# Patient Record
Sex: Female | Born: 1995 | Race: Black or African American | Hispanic: No | Marital: Single
Health system: Southern US, Community
[De-identification: ages and names within clinical notes are randomized; demographics above are authoritative.]

## PROBLEM LIST (undated history)

## (undated) DIAGNOSIS — F913 Oppositional defiant disorder: Secondary | ICD-10-CM

## (undated) DIAGNOSIS — Q8740 Marfan's syndrome, unspecified: Secondary | ICD-10-CM

## (undated) DIAGNOSIS — E079 Disorder of thyroid, unspecified: Secondary | ICD-10-CM

## (undated) DIAGNOSIS — F4310 Post-traumatic stress disorder, unspecified: Secondary | ICD-10-CM

## (undated) DIAGNOSIS — F319 Bipolar disorder, unspecified: Secondary | ICD-10-CM

---
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
## 2014-02-18 ENCOUNTER — Encounter (HOSPITAL_COMMUNITY): Payer: Self-pay | Admitting: Emergency Medicine

## 2014-02-18 ENCOUNTER — Emergency Department (HOSPITAL_COMMUNITY): Payer: Self-pay

## 2014-02-18 ENCOUNTER — Emergency Department (HOSPITAL_COMMUNITY)
Admission: EM | Admit: 2014-02-18 | Discharge: 2014-02-18 | Disposition: A | Discharge status: Home / Self Care | Payer: Self-pay | Attending: Emergency Medicine | Admitting: Emergency Medicine

## 2014-02-18 DIAGNOSIS — R079 Chest pain, unspecified: Secondary | ICD-10-CM | POA: Insufficient documentation

## 2014-02-18 DIAGNOSIS — F411 Generalized anxiety disorder: Secondary | ICD-10-CM | POA: Insufficient documentation

## 2014-02-18 DIAGNOSIS — Z5900 Homelessness unspecified: Secondary | ICD-10-CM | POA: Insufficient documentation

## 2014-02-18 DIAGNOSIS — Q8740 Marfan's syndrome, unspecified: Secondary | ICD-10-CM | POA: Insufficient documentation

## 2014-02-18 DIAGNOSIS — F419 Anxiety disorder, unspecified: Secondary | ICD-10-CM

## 2014-02-18 MED ORDER — IBUPROFEN 800 MG PO TABS
800.0000 mg | ORAL_TABLET | Freq: Once | ORAL | Status: AC
  Administered 2014-02-18: 800 mg via ORAL
  Filled 2014-02-18: qty 1

## 2014-02-18 NOTE — ED Notes (Signed)
Pt has very limited information, per EMS pt is currently homeless, was in foster care until she was [age] and since has been wondering the streets, ems reports that she has a heart condition that she hasn't taken her meds for because she can't afford them.

## 2014-02-18 NOTE — ED Notes (Signed)
Case worker spoke to pt and will consult with SW

## 2014-02-18 NOTE — ED Notes (Signed)
Report given to Dana, RN

## 2014-02-18 NOTE — Progress Notes (Signed)
CARE MANAGEMENT ED NOTE 02/18/2014  Patient:  [name]   Account Number:  [id number]  Date Initiated:  02/18/2014  Documentation initiated by:  [facility],Maguire Sime  Subjective/Objective Assessment:   MEDS ASST CONS.     Subjective/Objective Assessment Detail:     Action/Plan:   PROVIDED W/MED ASST RESOURCES/PCP/HEALTH INSURANCE RESOURCES.   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[employer].SHE DOES NOT AVE A PCP/HEALTH INSURANCE/OR A HOME.STATES SHE DOES NOT TAKE MEDICINE.ENCOURAGED HER TO ALLOW THE HOSPITAL STAFF TO ASSIST HER W/MEDICAL TREATMENT,OTHERWISE I WILL NOT BE ABLE TO ASSIST HER W/MEDS.HE VOICED UNDERSTANDING.EXPLAINED THAT [facility] IS HIGHLY ENCOURAGED FOR HER TO GO TO TOMORROW FOR ACCESS TO MORE RESOURCES-MEDS,PCP,INSURANCE.HE STATED SHE COULD NOT PROMISE ME SHE WOULD GO.I EXPLAINED THE MEDS ASST PROGRAM:1XUSE/NO NARCOTICS/SELECT PHARMACIES/$3 CO PAY-SHE CANNOT AFFORD-WOULD NEED TO OVERRIDE.SHE COULD ONLY USE THIS SERVICE IF SHE ALLOWS FOR MEDICAL TREATMENT WHILE IN THE HOSPITAL.SHE VOICED UNDERSTANDING.ASKED IF SHE FEELS SAFE OR ABUSED-SHE SAID I DON'T WANT TO TALK ABOUT THAT.INFORMED HER THAT A CSW WOULD ALSO TRY TO HELP W/CONNECTING HER W/COMMUNIITY RESOURCES.SHE VOICED UNDERSTANDING.WILL MONITOR IF MEDS ASST NEEDED.UPDATED CSW/NURSE.  CARE MANAGEMENT ED NOTE 02/18/2014  [facility]  [name]   Account Number:  [id number]  Date Initiated:  02/18/2014  Documentation initiated by:  [facility]Dawn Kiper  Subjective/Objective Assessment:   MEDS ASST CONS.     Subjective/Objective Assessment Detail:     Action/Plan:   PROVIDED W/MED ASST  RESOURCES/PCP/HEALTH INSURANCE RESOURCES.   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[employer].SHE DOES NOT AVE A PCP/HEALTH INSURANCE/OR A HOME.STATES SHE DOES NOT TAKE MEDICINE.ENCOURAGED HER TO ALLOW THE HOSPITAL STAFF TO ASSIST HER W/MEDICAL TREATMENT,OTHERWISE I WILL NOT BE ABLE TO ASSIST HER W/MEDS.HE VOICED UNDERSTANDING.EXPLAINED THAT [facility] IS HIGHLY ENCOURAGED FOR HER TO GO TO TOMORROW FOR ACCESS TO MORE RESOURCES-MEDS,PCP,INSURANCE.HE STATED SHE COULD NOT PROMISE ME SHE WOULD GO.I EXPLAINED THE MEDS ASST PROGRAM:1XUSE/NO NARCOTICS/SELECT PHARMACIES/$3 CO PAY-SHE CANNOT AFFORD-WOULD NEED TO OVERRIDE.SHE COULD ONLY USE THIS SERVICE IF SHE ALLOWS FOR MEDICAL TREATMENT WHILE IN THE HOSPITAL.SHE VOICED UNDERSTANDING.ASKED IF SHE FEELS SAFE OR ABUSED-SHE SAID I DON'T WANT TO TALK ABOUT THAT.INFORMED HER THAT A CSW WOULD ALSO TRY TO HELP W/CONNECTING HER W/COMMUNIITY RESOURCES.SHE VOICED UNDERSTANDING.WILL MONITOR IF MEDS ASST NEEDED.UPDATED CSW/NURSE.  CARE MANAGEMENT ED NOTE 02/18/2014  Patient:  [name]   Account Number:  [id number]  Date Initiated:  02/18/2014  Documentation initiated by:  [facility],Landy Dunnavant  Subjective/Objective Assessment:   MEDS ASST CONS.     Subjective/Objective Assessment Detail:     Action/Plan:   PROVIDED W/MED ASST RESOURCES/PCP/HEALTH INSURANCE RESOURCES.   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[employer].SHE DOES NOT AVE A PCP/HEALTH INSURANCE/OR A HOME.STATES SHE DOES NOT TAKE MEDICINE.ENCOURAGED HER TO ALLOW THE HOSPITAL STAFF TO ASSIST HER W/MEDICAL TREATMENT,OTHERWISE I WILL NOT BE ABLE TO ASSIST HER W/MEDS.HE VOICED UNDERSTANDING.EXPLAINED THAT [facility] IS HIGHLY ENCOURAGED FOR HER TO GO TO TOMORROW FOR ACCESS TO MORE RESOURCES-MEDS,PCP,INSURANCE.HE STATED SHE COULD NOT PROMISE ME SHE WOULD GO.I EXPLAINED THE MEDS ASST PROGRAM:1XUSE/NO NARCOTICS/SELECT PHARMACIES/$3 CO PAY-SHE CANNOT AFFORD-WOULD NEED TO OVERRIDE.SHE COULD ONLY USE THIS SERVICE IF SHE ALLOWS FOR MEDICAL TREATMENT WHILE IN THE HOSPITAL.SHE VOICED UNDERSTANDING.ASKED IF SHE FEELS SAFE OR ABUSED-SHE SAID I DON'T WANT TO TALK ABOUT THAT.INFORMED HER THAT A CSW WOULD ALSO TRY TO HELP W/CONNECTING HER W/COMMUNIITY RESOURCES.SHE VOICED UNDERSTANDING.WILL MONITOR IF MEDS ASST NEEDED.UPDATED CSW/NURSE.  CARE MANAGEMENT ED NOTE 02/18/2014  Patient:  [facility]   Account Number:  [id number]  Date Initiated:  02/18/2014  Documentation initiated by:  [facility]  Subjective/Objective Assessment:   MEDS ASST CONS.     Subjective/Objective Assessment Detail:     Action/Plan:   PROVIDED W/MED ASST RESOURCES/PCP/HEALTH INSURANCE RESOURCES.   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[employer].SHE DOES NOT AVE A PCP/HEALTH INSURANCE/OR A HOME.STATES SHE DOES NOT TAKE MEDICINE.ENCOURAGED HER TO ALLOW THE HOSPITAL STAFF TO ASSIST HER W/MEDICAL TREATMENT,OTHERWISE I WILL NOT BE  ABLE TO ASSIST HER W/MEDS.HE VOICED UNDERSTANDING.EXPLAINED THAT [facility] IS HIGHLY ENCOURAGED FOR HER TO GO TO TOMORROW FOR ACCESS TO MORE RESOURCES-MEDS,PCP,INSURANCE.HE STATED SHE COULD NOT PROMISE ME SHE WOULD GO.I EXPLAINED THE MEDS ASST PROGRAM:1XUSE/NO NARCOTICS/SELECT PHARMACIES/$3 CO PAY-SHE CANNOT AFFORD-WOULD NEED TO OVERRIDE.SHE COULD ONLY USE THIS SERVICE IF SHE ALLOWS FOR MEDICAL TREATMENT WHILE IN THE HOSPITAL.SHE VOICED UNDERSTANDING.ASKED IF SHE FEELS SAFE OR ABUSED-SHE SAID I DON'T WANT TO TALK ABOUT THAT.INFORMED HER THAT A CSW WOULD ALSO TRY TO HELP W/CONNECTING HER W/COMMUNIITY RESOURCES.SHE VOICED UNDERSTANDING.WILL MONITOR IF MEDS ASST NEEDED.UPDATED CSW/NURSE.  CARE MANAGEMENT ED NOTE 02/18/2014  Patient:  [facility]   Account Number:  [id number]  Date Initiated:  02/18/2014  Documentation initiated by:  [facility]  Subjective/Objective Assessment:   MEDS ASST CONS.     Subjective/Objective Assessment Detail:     Action/Plan:   PROVIDED W/MED ASST RESOURCES/PCP/HEALTH INSURANCE RESOURCES.   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[employer].SHE DOES NOT AVE A PCP/HEALTH INSURANCE/OR A HOME.STATES SHE DOES NOT TAKE MEDICINE.ENCOURAGED HER TO ALLOW THE HOSPITAL STAFF TO ASSIST HER W/MEDICAL TREATMENT,OTHERWISE I WILL NOT BE ABLE TO ASSIST HER W/MEDS.HE VOICED UNDERSTANDING.EXPLAINED THAT [facility] IS HIGHLY ENCOURAGED FOR HER TO GO TO TOMORROW FOR ACCESS TO MORE RESOURCES-MEDS,PCP,INSURANCE.HE STATED SHE COULD NOT PROMISE ME SHE WOULD GO.I EXPLAINED THE MEDS ASST PROGRAM:1XUSE/NO NARCOTICS/SELECT  PHARMACIES/$3 CO PAY-SHE CANNOT AFFORD-WOULD NEED TO OVERRIDE.SHE COULD ONLY USE THIS SERVICE IF SHE ALLOWS FOR MEDICAL TREATMENT WHILE IN THE HOSPITAL.SHE VOICED UNDERSTANDING.ASKED IF SHE FEELS SAFE OR ABUSED-SHE SAID I DON'T WANT TO TALK ABOUT THAT.INFORMED HER THAT A CSW WOULD ALSO TRY TO HELP W/CONNECTING HER W/COMMUNIITY RESOURCES.SHE VOICED UNDERSTANDING.WILL MONITOR IF MEDS ASST NEEDED.UPDATED CSW/NURSE.

## 2014-02-18 NOTE — ED Notes (Signed)
Pt requesting to speak to a Child psychotherapistsocial worker while she is here

## 2014-02-18 NOTE — Progress Notes (Signed)
CARE MANAGEMENT ED NOTE 02/18/2014  Patient:  [name],[name]   Account Number:  [id number]  Date Initiated:  02/18/2014  Documentation initiated by:  [facility],Elmar Antigua  Subjective/Objective Assessment:   MEDS ASST CONS.     Subjective/Objective Assessment Detail:     Action/Plan:   PROVIDED W/MED ASST RESOURCES/PCP/HEALTH INSURANCE RESOURCES.   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[employer].SHE DOES NOT AVE A PCP/HEALTH INSURANCE/OR A HOME.STATES SHE DOES NOT TAKE MEDICINE.ENCOURAGED HER TO ALLOW THE HOSPITAL STAFF TO ASSIST HER W/MEDICAL TREATMENT,OTHERWISE I WILL NOT BE ABLE TO ASSIST HER W/MEDS.HE VOICED UNDERSTANDING.EXPLAINED THAT [facility] IS HIGHLY ENCOURAGED FOR HER TO GO TO TOMORROW FOR ACCESS TO MORE RESOURCES-MEDS,PCP,INSURANCE.HE STATED SHE COULD NOT PROMISE ME SHE WOULD GO.I EXPLAINED THE MEDS ASST PROGRAM:1XUSE/NO NARCOTICS/SELECT PHARMACIES/$3 CO PAY-SHE CANNOT AFFORD-WOULD NEED TO OVERRIDE.SHE COULD ONLY USE THIS SERVICE IF SHE ALLOWS FOR MEDICAL TREATMENT WHILE IN THE HOSPITAL.SHE VOICED UNDERSTANDING.ASKED IF SHE FEELS SAFE OR ABUSED-SHE SAID I DON'T WANT TO TALK ABOUT THAT.INFORMED HER THAT A CSW WOULD ALSO TRY TO HELP W/CONNECTING HER W/COMMUNIITY RESOURCES.SHE VOICED UNDERSTANDING.WILL MONITOR IF MEDS ASST NEEDED.UPDATED CSW/NURSE.

## 2014-02-18 NOTE — ED Provider Notes (Signed)
CSN: [id number]     Arrival date & time 02/18/14  0310 History   First MD Initiated Contact with Patient 02/18/14 [phone]     Chief Complaint  Patient presents with  . Anxiety  . Headache     (Consider location/radiation/quality/duration/timing/severity/associated sxs/prior Treatment) HPI Pt presenting with c/o chest pain. She states she has hx of Marfan's syndrome.  She states she has been in foster care her entire life and turned 17 earlier this year.  She states she has been staying different places and last night had argument with her friend which caused her to have to leave the house where she had been staying.  She states she was walking outside for several hours and then developed a sharp pain in her chest.  She states she then called 911.  She also c/o diffuse headache.  She denies SI/HI.  She is requesting to see a Child psychotherapistsocial worker to talk about her situation.  She states she doesn't know what to do because she is still in school and hasn't been able to graduate.  No sob, no leg swelling, no recent travel/trauma/surgery.  There are no other associated systemic symptoms, there are no other alleviating or modifying factors.   History reviewed. No pertinent past medical history. History reviewed. No pertinent past surgical history. History reviewed. No pertinent family history. History  Substance Use Topics  . Smoking status: Never Smoker   . Smokeless tobacco: Not on file  . Alcohol Use: No   OB History   Grav Para Term Preterm Abortions TAB SAB Ect Mult Living                 Review of Systems ROS reviewed and all otherwise negative except for mentioned in HPI    Allergies  Review of patient's allergies indicates no known allergies.  Home Medications  No current outpatient prescriptions on file. BP 122/69  Pulse 87  Temp(Src) 98.2 F (36.8 C) (Oral)  Resp 20  Ht 5' (1.524 m)  Wt 150 lb (68.04 kg)  BMI 29.30 kg/m2  SpO2 99%  LMP 01/18/2014 Vitals reviewed Physical  Exam Physical Examination: General appearance - alert, well appearing, and in no distress Mental status - alert, oriented to person, place, and time Eyes - no scleral icterus, no conjunctival injection Mouth - mucous membranes moist, pharynx normal without lesions Chest - clear to auscultation, no wheezes, rales or rhonchi, symmetric air entry Heart - normal rate, regular rhythm, normal S1, S2, no murmurs, rubs, clicks or gallops Abdomen - soft, nontender, nondistended, no masses or organomegaly Extremities - peripheral pulses normal, no pedal edema, no clubbing or cyanosis Skin - normal coloration and turgor, no rashes Psych- calm and cooperative  ED Course  Procedures (including critical care time)     11:06 AM pt has been seen by case management and social work.  They have given her resources and have called homeless shelters for her and she has declined.  SW is working on a [employer] voucher.    Labs Review Labs Reviewed - No data to display Imaging Review No results found.   EKG Interpretation None      MDM   Final diagnoses:  Chest pain  Anxiety  Homelessness    Pt presents primarily to the ED due to not having anywhere to stay.  She graduated from foster care at age 18 and has been staying with different people since then.  Last night after an argument at the house where she had been  staying she left and walked outside in the cold.  She had a sharp chest pain and then called 911.  She has refused EKG and CXR citing her inability to pay for these as her reason for not having them done.  I have discussed with her that we need to rule out emergent medical causes for her chest pain, she mentions having a hx of marfan.  Her primary reason however for coming to the ED was to see a Child psychotherapist.  She has been seen by social work and case management- they have given her resources - she has declined a homeless shelter at this time.      Ethelda Chick, MD 02/18/14 1351

## 2014-02-18 NOTE — ED Notes (Signed)
Pt resting in room, in no acute distress 

## 2014-02-18 NOTE — ED Notes (Signed)
Pt refused EKG and CXR stating,"I can't afford it." Dr. Karma GanjaLinker notified. Pt reports that she has hx of migraines and has HA 7/10. Pt requests to speak to social work. Will notify SW when they arrive. Pt is A&O and in NAD.

## 2014-02-18 NOTE — Progress Notes (Signed)
CSW met with pt to discuss housing resources.   Pt raised in foster care and turned 17 in July. Has been staying with friends and in hotels since then. Currently going to [facility]. Pt states that she is enrolled in [employer] and will be entering the program at the end of the month.  Pt stated that she was staying with a friend from school until last night when pt was put out because she did not have enough money to pay to stay. Pt called job Designer, [employer], who agreed to work on arranging housing until she can enroll in the program at end of April. Pt then called friend she was staying with, and arranged for a further short term stay of 1-2 days.   Provided list of housing resources. Provided [facility], a center specifically for individuals aging out of foster care. Reviewed  community clinic information provided by RN CM. Discussed medicaid application process, as she qualifies.  Rochele Pages,     ED CSW  phone: [phone] 10:45am

## 2014-02-18 NOTE — ED Notes (Signed)
Spoke with Trinna PostAlex, SW about pt situation. Alex to see pt in ED as well as talk to case management to see pt.

## 2014-02-18 NOTE — ED Notes (Addendum)
Patient refused EKG. RN Darl PikesSusan notified as well as MD Karma GanjaLinker

## 2014-02-18 NOTE — Discharge Instructions (Signed)
Return to the ED with any concerns including difficulty breathing, fainting, decreased level of alertness/lethargy, or any other alarming symptoms   Emergency Department Resource Guide 1) Find a Doctor and Pay Out of Pocket Although you won't have to find out who is covered by your insurance plan, it is a good idea to ask around and get recommendations. You will then need to call the office and see if the doctor you have chosen will accept you as a new patient and what types of options they offer for patients who are self-pay. Some doctors offer discounts or will set up payment plans for their patients who do not have insurance, but you will need to ask so you aren't surprised when you get to your appointment.  2) Contact Your Local Health Department Not all health departments have doctors that can see patients for sick visits, but many do, so it is worth a call to see if yours does. If you don't know where your local health department is, you can check in your phone book. The CDC also has a tool to help you locate your state's health department, and many state websites also have listings of all of their local health departments.  3) Find a Walk-in Clinic If your illness is not likely to be very severe or complicated, you may want to try a walk in clinic. These are popping up all over the country in pharmacies, drugstores, and shopping centers. They're usually staffed by nurse practitioners or physician assistants that have been trained to treat common illnesses and complaints. They're usually fairly quick and inexpensive. However, if you have serious medical issues or chronic medical problems, these are probably not your best option.  No Primary Care Doctor: - Call Health Connect at  [phone] - they can help you locate a primary care doctor that  accepts your insurance, provides certain services, etc. - Physician Referral Service- [phone]  Chronic Pain Problems: Organization          Address  Phone   Notes  [facility]  [phone]) [phone] Patients need to be referred by their primary care doctor.   Medication Assistance: Organization         Address  Phone   Notes  [facility] Medication [facility] [address] [place], [place] [address] [phone] --Must be a resident of [facility] [place] -- Must have NO insurance coverage whatsoever (no Medicaid/ Medicare, etc.) -- The pt. MUST have a primary care doctor that directs their care regularly and follows them in the community   MedAssist  [phone]   [employer]  [phone]    Agencies that provide inexpensive medical care: Organization         Address  Phone   Notes  [facility]  [phone]) [phone]   [facility]    [phone]   [facility] [address] [place], [place] [address] [phone]   [facility] [address], [place] [phone]) [phone]   [facility]    [phone]   [facility]    [phone]) [phone]   [facility]  [address], [place] Phone:  [phone], Fax:  [phone]) [phone] Hours of Operation:  9 am - 6 pm, M-F.  Also accepts Medicaid/Medicare and self-pay.  [facility]  [address], [place] Phone: [phone]) [phone], Fax: [phone]. Hours of Operation:  8:30 am - 5:30 pm, M-F.  Also accepts Medicaid and self-pay.  [facility] [address] [place] Phone: [phone]   [place], [place], [place] [phone], Ext. [phone] Mondays & Thursdays: 7-9 AM.  First 15 patients are seen on a first come, first serve basis.    [place] Providers:  Organization         Address  Phone   Notes  [facility] [address], [place] [phone] Also accepts self-pay patients.  [facility] [address], [place]  [phone]   [place], [address], [place] [phone]   [facility] [address] [place] [phone]   Lucianne Lei [address], [place]   [phone] Only accepts [place] Access [place] patients after they have their name applied to their card.   Self-Pay (no insurance) in [facility]:  Organization         Address  Phone   Notes  Sickle Cell Patients, [facility] [place] [phone]   [facility] [phone]   [facility]  [place], [place], [place] [phone]   [facility]/Dr. Osei-Bonsu  [address] [place] or [address], [place] [phone] Phone number for both [place] and [place] locations is the same.  [facility] [address], [place] [phone]   [facility] [address] [place] or [address] [phone]   [facility] [address], [place] [phone], phone; [phone], fax Sees patients 1st and 3rd Saturday of every month.  Must not qualify for public or private insurance (i.e. Medicaid, Medicare, [place] Health Choice, Veterans' Benefits)  Household income should be no more than 200% of the poverty level The clinic cannot treat you if you are pregnant or think you are pregnant  Sexually transmitted diseases are not treated at the clinic.    Dental Care: Organization         Address  Phone  Notes  [facility] Department of [facility] [place] [phone] Accepts children up to age 48 who are enrolled in [place] or [place]; pregnant women with a Medicaid card; and  children who have applied for Medicaid or [place] Health Choice, but were declined, whose parents can pay a reduced fee at time of service.  [facility] Department of [facility]  [address], [place] [phone] Accepts children up to age 27 who are enrolled in [place] or [place]; pregnant women with a Medicaid card; and children who have applied for Medicaid or [place] Health Choice, but were declined, whose parents can pay a reduced fee at time of service.  [facility] Access PROGRAM  [place] [phone] Patients are seen by appointment only. Walk-ins are not accepted. [place] will see patients 50 years of age and older. Monday - Tuesday (8am-5pm) Most Wednesdays (8:30-5pm) $30 per visit, cash only  [facility] Adult [employer] PROGRAM  [address], [place] [phone]  Patients are seen by appointment only. Walk-ins are not accepted. [place] will see patients 8 years of age and older. One Wednesday Evening (Monthly: Volunteer Based).  $30 per visit, cash only  [place]  [phone] for adults; Children under age 70, call Graduate Pediatric Dentistry at [phone]. Children aged 44-14, please call [phone] to request a pediatric application.  Dental services are provided in all areas of dental care including fillings, crowns and bridges, complete and partial dentures, implants, gum treatment, root canals, and extractions. Preventive care is also provided. Treatment is provided to both adults and children. Patients are selected via a lottery and there is often a waiting list.   [facility] [address] [place]  [phone] [email or web address]   [facility] [address] [place], [place] [phone], Ext. [phone] Second and Fourth Thursday of each month, opens at 6:30 AM; Clinic ends at 9 AM.  Patients are seen on a first-come first-served  basis, and a limited number are seen during each clinic.   [facility]  [address] Hillard Danker [place], [place] [phone]   Eligibility Requirements You must have lived in [place], [place], or [place] counties for at least the last three months.   You cannot be eligible for state or federal sponsored [employer], including [employer], [place], or [employer].   You generally cannot be eligible for healthcare insurance through your employer.    How to apply: Eligibility screenings are held every Tuesday and Wednesday afternoon from 1:00 pm until 4:00 pm. You do not need an appointment for the interview!  [facility] [address], [place], [place]   [place] Department  [place]  [phone]    Behavioral Health Resources in the Community: Intensive Outpatient Programs Organization         Address  Phone  Notes  [facility] [address], [place], [place] [phone]   [facility] [address] [place], [place]   ADS: Alcohol & Drug Svcs [address] [place], [place]   [place] [address]  [place], [place] or [phone]   Substance Abuse Resources Organization         Address  Phone  Notes  Alcohol and Drug Services  [phone]   [place]  [phone]   [facility] [place]   Chinita Pester  [phone]   Residential & Outpatient Substance Abuse Program  [phone]   Psychological Services Organization         Address  Phone  Notes  [facility] [place]  Reserve  [phone]   [place] [address], [place] or [phone]    Mobile Crisis Teams Organization          Address  Phone  Notes  [facility], Mobile Crisis Care Unit  [phone]   [facility]  [address] [place], [place]   [place] [address], [place] [phone]    Self-Help/Support Groups Organization         Address  Phone             Notes  [place]. [facility] - variety of support groups  [place]  Call for more information  Narcotics Anonymous (NA), [facility] [address], [employer] [place]  2 meetings at this location   Residential Facilities manager         Address  Phone  Notes  [facility] [place],    [place]  [phone]   [facility]  [address] [place] 370488, [place], [place]   [place], [place] [phone] Admissions: 8am-3pm M-F  Incentives Substance [place] [address]    [place], [place] [phone]   [facility] [address] [place], [place], [place]   [facility] [address]  [place], [place]   [facility] - Intensive Outpatient [address], [place], [place]   [facility] ([place].,  [place], [place] [phone] or [phone]   [facility] (RTS) [address], [place], [place] Accepts Medicaid  [facility] [place] [address],  [place] [phone] Substance Abuse/Addiction Treatment   [facility] Organization         Address  Phone  Notes  [facility]  [phone]   Domenic Schwab, PhD [address] Arlis [place], [place]   [phone] or [phone]   [place], [place] [phone]   [facility] [address] [place], [place] [phone] Insurance/Medicaid/sponsorship  through [facility] Families [address], [address] [place]                                    [place], [place] [phone] [place] [address], [place] [phone]    Dr. Adele Schilder  [phone]   [facility] Dept. 1) [address] [place] 2) [place] 3)  [place] of [place] [address], [place] [phone]  [phone]   [place] [phone] or [phone] (After Hours)

## 2014-02-19 NOTE — Care Management ED Note (Addendum)
       CARE MANAGEMENT ED NOTE 02/18/2014  Patient:  [name],[name]   Account Number:  [id number]  Date Initiated:  02/18/2014  Documentation initiated by:  [facility]HABIR,Ahmod Gillespie  Subjective/Objective Assessment:   MEDS ASST CONS.     Subjective/Objective Assessment Detail:     Action/Plan:   PROVIDED W/MED ASST RESOURCES/PCP/HEALTH INSURANCE RESOURCES.   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[employer].SHE DOES NOT AVE A PCP/HEALTH INSURANCE/OR A HOME.STATES SHE DOES NOT TAKE MEDICINE.ENCOURAGED HER TO ALLOW THE HOSPITAL STAFF TO ASSIST HER W/MEDICAL TREATMENT,OTHERWISE I WILL NOT BE ABLE TO ASSIST HER W/MEDS.HE VOICED UNDERSTANDING.EXPLAINED THAT THE [facility] IS HIGHLY ENCOURAGED FOR HER TO GO TO TOMORROW FOR ACCESS TO MORE RESOURCES-MEDS,PCP,INSURANCE.HE STATED SHE COULD NOT PROMISE ME SHE WOULD GO.I EXPLAINED THE MEDS ASST PROGRAM:1XUSE/NO NARCOTICS/SELECT PHARMACIES/$3 CO PAY-SHE CANNOT AFFORD-WOULD NEED TO OVERRIDE.SHE COULD ONLY USE THIS SERVICE IF SHE ALLOWS FOR MEDICAL TREATMENT WHILE IN THE HOSPITAL.SHE VOICED UNDERSTANDING.ASKED IF SHE FEELS SAFE OR ABUSED-SHE SAID I DON'T WANT TO TALK ABOUT THAT.INFORMED HER THAT A CSW WOULD ALSO TRY TO HELP W/CONNECTING HER W/COMMUNIITY RESOURCES.SHE VOICED UNDERSTANDING.WILL MONITOR IF MEDS ASST NEEDED.UPDATED CSW/NURSE.

## 2014-07-03 ENCOUNTER — Encounter (HOSPITAL_COMMUNITY): Payer: Self-pay | Admitting: Emergency Medicine

## 2014-07-03 DIAGNOSIS — R112 Nausea with vomiting, unspecified: Secondary | ICD-10-CM | POA: Diagnosis not present

## 2014-07-03 DIAGNOSIS — Q8740 Marfan's syndrome, unspecified: Secondary | ICD-10-CM | POA: Diagnosis not present

## 2014-07-03 DIAGNOSIS — Z8639 Personal history of other endocrine, nutritional and metabolic disease: Secondary | ICD-10-CM | POA: Insufficient documentation

## 2014-07-03 DIAGNOSIS — R51 Headache: Secondary | ICD-10-CM | POA: Diagnosis present

## 2014-07-03 DIAGNOSIS — R42 Dizziness and giddiness: Secondary | ICD-10-CM | POA: Diagnosis not present

## 2014-07-03 DIAGNOSIS — Z862 Personal history of diseases of the blood and blood-forming organs and certain disorders involving the immune mechanism: Secondary | ICD-10-CM | POA: Diagnosis not present

## 2014-07-03 LAB — COMPREHENSIVE METABOLIC PANEL
ALK PHOS: 56 U/L (ref 39–117)
AST: 14 U/L (ref 0–37)
Albumin: 3.9 g/dL (ref 3.5–5.2)
Anion gap: 10 (ref 5–15)
BILIRUBIN TOTAL: 1.1 mg/dL (ref 0.3–1.2)
BUN: 10 mg/dL (ref 6–23)
CHLORIDE: 101 meq/L (ref 96–112)
CO2: 25 mEq/L (ref 19–32)
Calcium: 9.6 mg/dL (ref 8.4–10.5)
Creatinine, Ser: 0.56 mg/dL (ref 0.50–1.10)
GFR calc non Af Amer: >90 mL/min (ref >90)
GLUCOSE: 118 mg/dL — AB (ref 70–99)
POTASSIUM: 3.8 meq/L (ref 3.7–5.3)
SODIUM: 136 meq/L — AB (ref 137–147)
Total Protein: 7.8 g/dL (ref 6.0–8.3)

## 2014-07-03 LAB — CBC
HCT: 33.8 % — ABNORMAL LOW (ref 36.0–46.0)
Hemoglobin: 11 g/dL — ABNORMAL LOW (ref 12.0–15.0)
MCH: 25.6 pg — AB (ref 26.0–34.0)
MCHC: 32.5 g/dL (ref 30.0–36.0)
MCV: 78.6 fL (ref 78.0–100.0)
Platelets: 208 10*3/uL (ref 150–400)
RBC: 4.3 MIL/uL (ref 3.87–5.11)
RDW: 14.9 % (ref 11.5–15.5)
WBC: 3.1 10*3/uL — ABNORMAL LOW (ref 4.0–10.5)

## 2014-07-03 NOTE — ED Notes (Signed)
Pt reports she is homeless; been with a friend but was headed to stay in a church tonight. Pt reports she needs so help and resources. Denies having family in the area. Denies abuse.

## 2014-07-03 NOTE — ED Notes (Signed)
Pt reports nausea, vomiting, headache, light sensitivity, dizziness when woke up this morning around 0800. NO known hx of migraines. Hypothyroidism and not taking meds since September 2014. 4 zofran given by EMS and pt is feeling better.

## 2014-07-04 ENCOUNTER — Emergency Department (HOSPITAL_COMMUNITY): Payer: Medicaid Other

## 2014-07-04 ENCOUNTER — Emergency Department (HOSPITAL_COMMUNITY)
Admission: EM | Admit: 2014-07-04 | Discharge: 2014-07-04 | Discharge status: Against Medical Advice | Payer: Medicaid Other | Attending: Emergency Medicine | Admitting: Emergency Medicine

## 2014-07-04 ENCOUNTER — Encounter (HOSPITAL_COMMUNITY): Payer: Self-pay

## 2014-07-04 DIAGNOSIS — R51 Headache: Secondary | ICD-10-CM

## 2014-07-04 DIAGNOSIS — R519 Headache, unspecified: Secondary | ICD-10-CM

## 2014-07-04 HISTORY — DX: Marfan syndrome, unspecified: Q87.40

## 2014-07-04 HISTORY — DX: Disorder of thyroid, unspecified: E07.9

## 2014-07-04 LAB — TSH: TSH: 1.34 u[IU]/mL (ref 0.350–4.500)

## 2014-07-04 MED ORDER — ONDANSETRON HCL 4 MG/2ML IJ SOLN
4.0000 mg | Freq: Once | INTRAMUSCULAR | Status: AC
  Administered 2014-07-04: 4 mg via INTRAVENOUS
  Filled 2014-07-04 (×2): qty 2

## 2014-07-04 MED ORDER — SODIUM CHLORIDE 0.9 % IV BOLUS (SEPSIS)
1000.0000 mL | Freq: Once | INTRAVENOUS | Status: AC
  Administered 2014-07-04: 1000 mL via INTRAVENOUS

## 2014-07-04 MED ORDER — DIPHENHYDRAMINE HCL 50 MG/ML IJ SOLN
25.0000 mg | Freq: Once | INTRAMUSCULAR | Status: AC
  Administered 2014-07-04: 25 mg via INTRAVENOUS
  Filled 2014-07-04 (×2): qty 1

## 2014-07-04 MED ORDER — METOCLOPRAMIDE HCL 5 MG/ML IJ SOLN
10.0000 mg | Freq: Once | INTRAMUSCULAR | Status: AC
  Administered 2014-07-04: 10 mg via INTRAVENOUS
  Filled 2014-07-04 (×2): qty 2

## 2014-07-04 NOTE — ED Notes (Signed)
Patient transported to CT 

## 2014-07-04 NOTE — Discharge Instructions (Signed)
General Headache Without Cause You are leaving against medical advice as you do not want a lumbar puncture or CT angiogram. Return to the ED if you wish to be reevaluated. A headache is pain or discomfort felt around the head or neck area. The specific cause of a headache may not be found. There are many causes and types of headaches. A few common ones are:  Tension headaches.  Migraine headaches.  Cluster headaches.  Chronic daily headaches. HOME CARE INSTRUCTIONS   Keep all follow-up appointments with your caregiver or any specialist referral.  Only take over-the-counter or prescription medicines for pain or discomfort as directed by your caregiver.  Lie down in a dark, quiet room when you have a headache.  Keep a headache journal to find out what may trigger your migraine headaches. For example, write down:  What you eat and drink.  How much sleep you get.  Any change to your diet or medicines.  Try massage or other relaxation techniques.  Put ice packs or heat on the head and neck. Use these 3 to 4 times per day for 15 to 20 minutes each time, or as needed.  Limit stress.  Sit up straight, and do not tense your muscles.  Quit smoking if you smoke.  Limit alcohol use.  Decrease the amount of caffeine you drink, or stop drinking caffeine.  Eat and sleep on a regular schedule.  Get 7 to 9 hours of sleep, or as recommended by your caregiver.  Keep lights dim if bright lights bother you and make your headaches worse. SEEK MEDICAL CARE IF:   You have problems with the medicines you were prescribed.  Your medicines are not working.  You have a change from the usual headache.  You have nausea or vomiting. SEEK IMMEDIATE MEDICAL CARE IF:   Your headache becomes severe.  You have a fever.  You have a stiff neck.  You have loss of vision.  You have muscular weakness or loss of muscle control.  You start losing your balance or have trouble walking.  You  feel faint or pass out.  You have severe symptoms that are different from your first symptoms. MAKE SURE YOU:   Understand these instructions.  Will watch your condition.  Will get help right away if you are not doing well or get worse. Document Released: 11/02/2005 Document Revised: 01/25/2012 Document Reviewed: 11/18/2011 [facility] Patient Information 2015 [place], [place]. This information is not intended to replace advice given to you by your health care provider. Make sure you discuss any questions you have with your health care provider.

## 2014-07-04 NOTE — ED Provider Notes (Signed)
CSN: [id number]     Arrival date & time 07/03/14  2244 History   First MD Initiated Contact with Patient 07/04/14 0209     Chief Complaint  Patient presents with  . Emesis     (Consider location/radiation/quality/duration/timing/severity/associated sxs/prior Treatment) HPI Comments: Patient awoke with diffuse headache this morning and felt lightheaded. She took some medicine and some food and began to have nausea and vomiting. After having Tylenol she had 4-5 episodes of vomiting. Her headache gradually got worse. Denies thunderclap onset. Denies fever. No history of migraine headaches. She endorses photophobia. She denies any phonophobia. She has a history of Marfan's syndrome and hypothyroidism. She's not had any medications since October. Denies any chest pain, abdominal pain, back pain. No focal weakness, numbness or tingling. No blurry vision or double vision. She received Zofran with some relief. Denies being pregnant. Denies sick contacts.  The history is provided by the patient.    Past Medical History  Diagnosis Date  . Thyroid disease   . Marfan's syndrome    No past surgical history on file. No family history on file. History  Substance Use Topics  . Smoking status: Not on file  . Smokeless tobacco: Not on file  . Alcohol Use: Not on file   OB History   Grav Para Term Preterm Abortions TAB SAB Ect Mult Living                 Review of Systems  Constitutional: Negative for fever, activity change and appetite change.  HENT: Negative for congestion.   Respiratory: Negative for cough, chest tightness and shortness of breath.   Cardiovascular: Negative for chest pain.  Gastrointestinal: Positive for nausea and vomiting. Negative for abdominal pain.  Genitourinary: Negative for dysuria, hematuria, vaginal bleeding and vaginal discharge.  Musculoskeletal: Negative for arthralgias, back pain, myalgias and neck pain.  Skin: Negative for rash.  Neurological: Positive for  dizziness, light-headedness and headaches. Negative for weakness.  A complete 10 system review of systems was obtained and all systems are negative except as noted in the HPI and PMH.      Allergies  Review of patient's allergies indicates no known allergies.  Home Medications   Prior to Admission medications   Not on File   BP 113/74  Pulse 54  Temp(Src) 98.8 F (37.1 C) (Oral)  Resp 16  Wt 156 lb (70.761 kg)  SpO2 100%  LMP 05/27/2014 Physical Exam  Nursing note and vitals reviewed. Constitutional: She is oriented to person, place, and time. She appears well-developed and well-nourished. No distress.  marfanoid  HENT:  Head: Normocephalic and atraumatic.  Mouth/Throat: Oropharynx is clear and moist. No oropharyngeal exudate.  No temporal artery tenderness  Eyes: Conjunctivae and EOM are normal. Pupils are equal, round, and reactive to light.  Neck: Normal range of motion. Neck supple.  No meningismus.  Cardiovascular: Normal rate, regular rhythm, normal heart sounds and intact distal pulses.   No murmur heard. Pulmonary/Chest: Effort normal and breath sounds normal. No respiratory distress.  Abdominal: Soft. There is no tenderness. There is no rebound and no guarding.  Musculoskeletal: Normal range of motion. She exhibits no edema and no tenderness.  Neurological: She is alert and oriented to person, place, and time. No cranial nerve deficit. She exhibits normal muscle tone. Coordination normal.  No ataxia on finger to nose bilaterally. No pronator drift. 5/5 strength throughout. CN 2-12 intact. Negative Romberg. Equal grip strength. Sensation intact. Gait is normal.   Skin: Skin is  warm.  Psychiatric: She has a normal mood and affect. Her behavior is normal.    ED Course  Procedures (including critical care time) Labs Review Labs Reviewed  COMPREHENSIVE METABOLIC PANEL - Abnormal; Notable for the following:    Sodium 136 (*)    Glucose, Bld 118 (*)    All other  components within normal limits  CBC - Abnormal; Notable for the following:    WBC 3.1 (*)    Hemoglobin 11.0 (*)    HCT 33.8 (*)    MCH 25.6 (*)    All other components within normal limits  TSH  URINALYSIS, ROUTINE W REFLEX MICROSCOPIC  PREGNANCY, URINE    Imaging Review Ct Head Wo Contrast  07/04/2014   CLINICAL DATA:  Headache and vomiting.  EXAM: CT HEAD WITHOUT CONTRAST  TECHNIQUE: Contiguous axial images were obtained from the base of the skull through the vertex without intravenous contrast.  COMPARISON:  None.  FINDINGS: The ventricles and sulci are normal. No intraparenchymal hemorrhage, mass effect nor midline shift. No acute large vascular territory infarcts.  No abnormal extra-axial fluid collections. Basal cisterns are patent.  No skull fracture. The included ocular globes and orbital contents are non-suspicious. The mastoid aircells and included paranasal sinuses are well-aerated.  IMPRESSION: No acute intracranial process; normal noncontrast CT of the head.   Electronically Signed   By: Awilda Metroourtnay  Bloomer   On: 07/04/2014 03:24     EKG Interpretation None      MDM   Final diagnoses:  Headache, unspecified headache type   headache with lightheadedness, nausea and vomiting. No fever. Nonfocal neurological exam.  CT scan negative. With history of Marfan's syndrome, there is increased concern for subarachnoid hemorrhage and possibly aneurysm.  Patient declines lumbar puncture. She also declines CT angiogram. She understands that CT scan could miss a small proportion of subarachnoid hemorrhage. She understands that subarachnoid hemorrhage could be life-threatening. She is alert and oriented x3. She has capacity to make her own medical decisions and refuses further evaluation of her headache. She states she is feeling better and wishes to go home. She understands she'll be leaving AGAINST MEDICAL ADVICE as lumbar puncture or CTA was recommended.  Glynn OctaveStephen Shara Hartis,  MD 07/04/14 [phone]

## 2014-07-04 NOTE — Progress Notes (Signed)
Patient refused CT Scan; Dr. Manus Gunningancour notified and patient returned to room in ED.

## 2014-10-15 ENCOUNTER — Encounter (HOSPITAL_COMMUNITY): Payer: Self-pay | Admitting: Emergency Medicine

## 2014-10-15 ENCOUNTER — Emergency Department (HOSPITAL_COMMUNITY)
Admission: EM | Admit: 2014-10-15 | Discharge: 2014-10-15 | Disposition: A | Discharge status: Home / Self Care | Payer: 59 | Attending: Emergency Medicine | Admitting: Emergency Medicine

## 2014-10-15 DIAGNOSIS — F4310 Post-traumatic stress disorder, unspecified: Secondary | ICD-10-CM | POA: Insufficient documentation

## 2014-10-15 DIAGNOSIS — Y998 Other external cause status: Secondary | ICD-10-CM | POA: Diagnosis not present

## 2014-10-15 DIAGNOSIS — Z79899 Other long term (current) drug therapy: Secondary | ICD-10-CM | POA: Insufficient documentation

## 2014-10-15 DIAGNOSIS — F32A Depression, unspecified: Secondary | ICD-10-CM

## 2014-10-15 DIAGNOSIS — E079 Disorder of thyroid, unspecified: Secondary | ICD-10-CM | POA: Diagnosis not present

## 2014-10-15 DIAGNOSIS — Z3202 Encounter for pregnancy test, result negative: Secondary | ICD-10-CM | POA: Diagnosis not present

## 2014-10-15 DIAGNOSIS — F913 Oppositional defiant disorder: Secondary | ICD-10-CM | POA: Insufficient documentation

## 2014-10-15 DIAGNOSIS — F329 Major depressive disorder, single episode, unspecified: Secondary | ICD-10-CM

## 2014-10-15 DIAGNOSIS — T7421XA Adult sexual abuse, confirmed, initial encounter: Secondary | ICD-10-CM | POA: Diagnosis not present

## 2014-10-15 DIAGNOSIS — Q8740 Marfan's syndrome, unspecified: Secondary | ICD-10-CM | POA: Diagnosis not present

## 2014-10-15 LAB — BASIC METABOLIC PANEL
Anion gap: 12 (ref 5–15)
BUN: 4 mg/dL — ABNORMAL LOW (ref 6–23)
CO2: 23 mEq/L (ref 19–32)
Calcium: 10.1 mg/dL (ref 8.4–10.5)
Chloride: 101 mEq/L (ref 96–112)
Creatinine, Ser: 0.5 mg/dL (ref 0.50–1.10)
Glucose, Bld: 103 mg/dL — ABNORMAL HIGH (ref 70–99)
POTASSIUM: 4.2 meq/L (ref 3.7–5.3)
SODIUM: 136 meq/L — AB (ref 137–147)

## 2014-10-15 LAB — CBC
HCT: 33.8 % — ABNORMAL LOW (ref 36.0–46.0)
HEMOGLOBIN: 10.6 g/dL — AB (ref 12.0–15.0)
MCH: 25.4 pg — ABNORMAL LOW (ref 26.0–34.0)
MCHC: 31.4 g/dL (ref 30.0–36.0)
MCV: 81.1 fL (ref 78.0–100.0)
PLATELETS: 218 10*3/uL (ref 150–400)
RBC: 4.17 MIL/uL (ref 3.87–5.11)
RDW: 15.2 % (ref 11.5–15.5)
WBC: 2.3 10*3/uL — ABNORMAL LOW (ref 4.0–10.5)

## 2014-10-15 LAB — ACETAMINOPHEN LEVEL

## 2014-10-15 LAB — POC URINE PREG, ED: Preg Test, Ur: NEGATIVE

## 2014-10-15 LAB — ETHANOL

## 2014-10-15 LAB — SALICYLATE LEVEL: Salicylate Lvl: <2 mg/dL — ABNORMAL LOW (ref 2.8–20.0)

## 2014-10-15 MED ORDER — PROMETHAZINE HCL 25 MG PO TABS: 25.0000 mg | ORAL_TABLET | Freq: Four times a day (QID) | ORAL | Status: DC | PRN

## 2014-10-15 MED ORDER — AZITHROMYCIN 1 G PO PACK
PACK | ORAL | Status: AC
  Administered 2014-10-15: 1 g via ORAL
  Filled 2014-10-15: qty 1

## 2014-10-15 MED ORDER — ACETAMINOPHEN 500 MG PO TABS
1000.0000 mg | ORAL_TABLET | Freq: Once | ORAL | Status: DC
  Filled 2014-10-15: qty 2

## 2014-10-15 MED ORDER — CIPROFLOXACIN HCL 500 MG PO TABS
500.0000 mg | ORAL_TABLET | Freq: Once | ORAL | Status: DC
  Filled 2014-10-15: qty 1

## 2014-10-15 MED ORDER — AZITHROMYCIN 1 G PO PACK
1.0000 g | PACK | Freq: Once | ORAL | Status: AC
  Filled 2014-10-15: qty 1

## 2014-10-15 MED ORDER — ULIPRISTAL ACETATE 30 MG PO TABS
30.0000 mg | ORAL_TABLET | Freq: Once | ORAL | Status: AC
  Filled 2014-10-15: qty 1

## 2014-10-15 MED ORDER — METRONIDAZOLE 500 MG PO TABS
ORAL_TABLET | ORAL | Status: AC
  Filled 2014-10-15: qty 4

## 2014-10-15 MED ORDER — CEFIXIME 400 MG PO TABS
400.0000 mg | ORAL_TABLET | Freq: Once | ORAL | Status: DC
  Filled 2014-10-15: qty 1

## 2014-10-15 MED ORDER — CEFIXIME 400 MG PO TABS
ORAL_TABLET | ORAL | Status: AC
  Filled 2014-10-15: qty 1

## 2014-10-15 MED ORDER — CEFPODOXIME PROXETIL 200 MG PO TABS
400.0000 mg | ORAL_TABLET | Freq: Once | ORAL | Status: DC
  Filled 2014-10-15: qty 2

## 2014-10-15 MED ORDER — PROMETHAZINE HCL 25 MG PO TABS
ORAL_TABLET | ORAL | Status: AC
  Filled 2014-10-15: qty 3

## 2014-10-15 MED ORDER — METRONIDAZOLE 500 MG PO TABS
2000.0000 mg | ORAL_TABLET | Freq: Once | ORAL | Status: DC
  Filled 2014-10-15: qty 4

## 2014-10-15 MED ORDER — ULIPRISTAL ACETATE 30 MG PO TABS
ORAL_TABLET | ORAL | Status: AC
  Administered 2014-10-15: 30 mg via ORAL
  Filled 2014-10-15: qty 1

## 2014-10-15 NOTE — ED Notes (Signed)
Pt reports feeling depressed and feeling worse since Saturday, she staes "something bad happened" and she had to leave house. Hx of depression , stopped taking her meds 1 year ago. Pt states she is homeless. Pt denies SI or HI.

## 2014-10-15 NOTE — Discharge Instructions (Signed)
Depression °Depression refers to feeling sad, low, down in the dumps, blue, gloomy, or empty. In general, there are two kinds of depression: °· Normal sadness or normal grief. This kind of depression is one that we all feel from time to time after upsetting life experiences, such as the loss of a job or the ending of a relationship. This kind of depression is considered normal, is short lived, and resolves within a few days to 2 weeks. Depression experienced after the loss of a loved one (bereavement) often lasts longer than 2 weeks but normally gets better with time. °· Clinical depression. This kind of depression lasts longer than normal sadness or normal grief or interferes with your ability to function at home, at work, and in school. It also interferes with your personal relationships. It affects almost every aspect of your life. Clinical depression is an illness. °Symptoms of depression can also be caused by conditions other than those mentioned above, such as: °· Physical illness. Some physical illnesses, including underactive thyroid gland (hypothyroidism), severe anemia, specific types of cancer, diabetes, uncontrolled seizures, heart and lung problems, strokes, and chronic pain are commonly associated with symptoms of depression. °· Side effects of some prescription medicine. In some people, certain types of medicine can cause symptoms of depression. °· Substance abuse. Abuse of alcohol and illicit drugs can cause symptoms of depression. °SYMPTOMS °Symptoms of normal sadness and normal grief include the following: °· Feeling sad or crying for short periods of time. °· Not caring about anything (apathy). °· Difficulty sleeping or sleeping too much. °· No longer able to enjoy the things you used to enjoy. °· Desire to be by oneself all the time (social isolation). °· Lack of energy or motivation. °· Difficulty concentrating or remembering. °· Change in appetite or weight. °· Restlessness or  agitation. °Symptoms of clinical depression include the same symptoms of normal sadness or normal grief and also the following symptoms: °· Feeling sad or crying all the time. °· Feelings of guilt or worthlessness. °· Feelings of hopelessness or helplessness. °· Thoughts of suicide or the desire to harm yourself (suicidal ideation). °· Loss of touch with reality (psychotic symptoms). Seeing or hearing things that are not real (hallucinations) or having false beliefs about your life or the people around you (delusions and paranoia). °DIAGNOSIS  °The diagnosis of clinical depression is usually based on how bad the symptoms are and how long they have lasted. Your health care provider will also ask you questions about your medical history and substance use to find out if physical illness, use of prescription medicine, or substance abuse is causing your depression. Your health care provider may also order blood tests. °TREATMENT  °Often, normal sadness and normal grief do not require treatment. However, sometimes antidepressant medicine is given for bereavement to ease the depressive symptoms until they resolve. °The treatment for clinical depression depends on how bad the symptoms are but often includes antidepressant medicine, counseling with a mental health professional, or both. Your health care provider will help to determine what treatment is best for you. °Depression caused by physical illness usually goes away with appropriate medical treatment of the illness. If prescription medicine is causing depression, talk with your health care provider about stopping the medicine, decreasing the dose, or changing to another medicine. °Depression caused by the abuse of alcohol or illicit drugs goes away when you stop using these substances. Some adults need professional help in order to stop drinking or using drugs. °SEEK IMMEDIATE MEDICAL   CARE IF:  You have thoughts about hurting yourself or others.  You lose touch  with reality (have psychotic symptoms).  You are taking medicine for depression and have a serious side effect. FOR MORE INFORMATION  National Alliance on Mental Illness: www.nami.[employer] of Mental Health: http://www.maynard.net/ Document Released: 10/30/2000 Document Revised: 03/19/2014 Document Reviewed: 02/01/2012 [facility] Patient Information 2015 [place], [place]. This information is not intended to replace advice given to you by your health care provider. Make sure you discuss any questions you have with your health care provider.  Sexual Assault or Rape Sexual assault is any sexual activity that a person is forced, threatened, or coerced into participating in. It may or may not involve physical contact. You are being sexually abused if you are forced to have sexual contact of any kind. Sexual assault is called rape if penetration has occurred (vaginal, oral, or anal). Many times, sexual assaults are committed by a friend, relative, or associate. Sexual assault and rape are never the victim's fault.  Sexual assault can result in various health problems for the person who was assaulted. Some of these problems include:  Physical injuries in the genital area or other areas of the body.  Risk of unwanted pregnancy.  Risk of sexually transmitted infections (STIs).  Psychological problems such as anxiety, depression, or posttraumatic stress disorder. WHAT STEPS SHOULD BE TAKEN AFTER A SEXUAL ASSAULT? If you have been sexually assaulted, you should take the following steps as soon as possible:  Go to a safe area as quickly as possible and call your local emergency services (911 in U.S.). Get away from the area where you have been attacked.   Do not wash, shower, comb your hair, or clean any part of your body.   Do not change your clothes.   Do not remove or touch anything in the area where you were assaulted.   Go to an emergency room for a complete physical exam. Get the  necessary tests to protect yourself from STIs or pregnancy. You may be treated for an STI even if no signs of one are present. Emergency contraceptive medicines are also available to help prevent pregnancy, if this is desired. You may need to be examined by a specially trained health care provider.  Have the health care provider collect evidence during the exam, even if you are not sure if you will file a report with the police.  Find out how to file the correct papers with the authorities. This is important for all assaults, even if they were committed by a family member or friend.  Find out where you can get additional help and support, such as a local rape crisis center.  Follow up with your health care provider as directed.  HOW CAN YOU REDUCE THE CHANCES OF SEXUAL ASSAULT? Take the following steps to help reduce your chances of being sexually assaulted:  Consider carrying mace or pepper spray for protection against an attacker.   Consider taking a self-defense course.  Do not try to fight off an attacker if he or she has a gun or knife.   Be aware of your surroundings, what is happening around you, and who might be there.   Be assertive, trust your instincts, and walk with confidence and direction.  Be careful not to drink too much alcohol or use other intoxicants. These can reduce your ability to fight off an assault.  Always lock your doors and windows. Be sure to have high-quality locks for your  home.   Do not let people enter your house if you do not know them.   Get a home security system that has a siren if you are able.   Protect the keys to your house and car. Do not lend them out. Do not put your name and address on them. If you lose them, get your locks changed.   Always lock your car and have your key ready to open the door before approaching the car.   Park in a well-lit and busy area.  Plan your driving routes so that you travel on well-lit and  frequently used streets.  Keep your car serviced. Always have at least half a tank of gas in it.   Do not go into isolated areas alone. This includes open garages, empty buildings or offices, or [employer] Donnelleypublic laundry rooms.   Do not walk or jog alone, especially when it is dark.   Never hitchhike.   If your car breaks down, call the police for help on your cell phone and stay inside the car with your doors locked and windows up.   If you are being followed, go to a busy area and call for help.   If you are stopped by a police officer, especially one in an unmarked police car, keep your door locked. Do not put your window down all the way. Ask the officer to show you identification first.   Be aware of "date rape drugs" that can be placed in a drink when you are not looking. These drugs can make you unable to fight off an assault. FOR MORE INFORMATION  Office on [employer]'s Health, U.S. Department of Health and Human Services: SecretaryNews.cawww.womenshealth.gov/violence-against-women/types-of-violence/sexual-assault-and-abuse.html  National Sexual Assault Hotline: 1-800-656-HOPE 770-126-4931(4673)  National Domestic Violence Hotline: 1-800-799-SAFE 867-637-9498(7233) or www.thehotline.org Document Released: 10/30/2000 Document Revised: 07/05/2013 Document Reviewed: 04/05/2013 [facility] Patient Information 2015 [place], [place]. This information is not intended to replace advice given to you by your health care provider. Make sure you discuss any questions you have with your health care provider.  Emergency Department Resource Guide 1) Find a Doctor and Pay Out of Pocket Although you won't have to find out who is covered by your insurance plan, it is a good idea to ask around and get recommendations. You will then need to call the office and see if the doctor you have chosen will accept you as a new patient and what types of options they offer for patients who are self-pay. Some doctors offer discounts or will set up payment plans  for their patients who do not have insurance, but you will need to ask so you aren't surprised when you get to your appointment.  2) Contact Your Local Health Department Not all health departments have doctors that can see patients for sick visits, but many do, so it is worth a call to see if yours does. If you don't know where your local health department is, you can check in your phone book. The CDC also has a tool to help you locate your state's health department, and many state websites also have listings of all of their local health departments.  3) Find a Walk-in Clinic If your illness is not likely to be very severe or complicated, you may want to try a walk in clinic. These are popping up all over the country in pharmacies, drugstores, and shopping centers. They're usually staffed by nurse practitioners or physician assistants that have been trained to treat common illnesses and complaints. They're usually  fairly quick and inexpensive. However, if you have serious medical issues or chronic medical problems, these are probably not your best option.  No Primary Care Doctor: - Call Health Connect at  [phone] - they can help you locate a primary care doctor that  accepts your insurance, provides certain services, etc. - Physician Referral Service- [phone]  Chronic Pain Problems: Organization         Address  Phone   Notes  [facility]  [phone] Patients need to be referred by their primary care doctor.   Medication Assistance: Organization         Address  Phone   Notes  [facility] Medication [facility] [address] [place], [place] [address] [phone]) [phone] --Must be a resident of [facility] [place] -- Must have NO insurance coverage whatsoever (no Medicaid/ Medicare, etc.) -- The pt. MUST have a primary care doctor that directs their care regularly and follows them in the community   MedAssist  [phone]   [employer]  [phone])  [phone]    Agencies that provide inexpensive medical care: Organization         Address  Phone   Notes  [facility]  [phone]   [facility]    [phone]   [facility] [phone] [place], [place] [address] [phone]) [phone]   [facility] [address], [place] [phone]   [facility]anned Parenthood    [phone]   [facility]    [phone]   [facility]  [address], [place] Phone:  [phone], Fax:  [phone] Hours of Operation:  9 am - 6 pm, M-F.  Also accepts Medicaid/Medicare and self-pay.  [facility]  [address], [place] Phone: [phone]) [phone], Fax: [phone]. Hours of Operation:  8:30 am - 5:30 pm, M-F.  Also accepts Medicaid and self-pay.  [facility] [place] [address], [place] Phone: [phone]) [phone]   [facility] [address], [place], [place] [phone], Ext. [phone] Mondays & Thursdays: 7-9 AM.  First 15 patients are seen on a first come, first serve basis.    Medicaid-accepting [facility] Providers:  Organization         Address  Phone   Notes  [facility] [address], [place] [phone] Also accepts self-pay patients.  [facility] [address], [place]  [phone]) [phone]   [facility] [address], [place] [phone]   [facility] [address] [place] [address], [place] [phone]   Renaye RakersVeita Bland [address], [place]   [phone] Only accepts [place] Access [place] patients after they have their name applied to their card.   Self-Pay (no insurance) in [facility] [place]:  Organization         Address  Phone   Notes  Sickle Cell Patients, [facility] [address], [place] [phone])  [phone]   [facility] [address], [place] [phone]   [facility] [place]  [address], [place] [phone]   [facility]/Dr. Osei-Bonsu  [address] [place] [address], [place] or [phone], [place] [phone]) [phone] Phone  number for both [place] and [place] locations is the same.  [facility] [address], [place] [phone]   [facility] [address], [place] or [address] [phone]   [facility] [address] [place] [phone], phone; [phone], fax Sees patients 1st and 3rd Saturday of every month.  Must not qualify for public or private insurance (i.e. Medicaid, Medicare, [place] Health Choice, Veterans' Benefits)  Household income should be no more than 200% of the poverty level The clinic cannot treat you if you are pregnant or think you are pregnant  Sexually transmitted diseases are not treated at the clinic.    Dental Care: Organization         Address  Phone  Notes  [facility] Department of [facility] [place] [phone] Accepts children up to age 54 who are enrolled in [place] or [place]; pregnant women with a Medicaid card; and children who have applied for Medicaid or [place] Health Choice, but were declined, whose parents can pay a reduced fee at time of service.  [facility] Department of [facility]  [address], [place] [phone] Accepts children up to age 37 who are enrolled in [place] or [place]; pregnant women with a Medicaid card; and children who have applied for Medicaid or [place] Health Choice, but were declined, whose parents can pay a reduced fee at time of service.  [facility] Adult Dental Access PROGRAM  [place] [phone] Patients are  seen by appointment only. Walk-ins are not accepted. [place] will see patients 73 years of age and older. Monday - Tuesday (8am-5pm) Most Wednesdays (8:30-5pm) $30 per visit, cash only  [facility] Adult Dental Access PROGRAM  [address], [facility] [phone] Patients are seen by appointment only. Walk-ins are not accepted. [place] will see patients 82 years of age and older. One Wednesday Evening (Monthly: Volunteer Based).  $30 per visit, cash only  [place]  [phone] for adults; Children under age 35, call Graduate Pediatric Dentistry at [phone]. Children aged 78-14, please call [phone] to request a pediatric application.  Dental services are provided in all areas of dental care including fillings, crowns and bridges, complete and partial dentures, implants, gum treatment, root canals, and extractions. Preventive care is also provided. Treatment is provided to both adults and children. Patients are selected via a lottery and there is often a waiting list.   [facility] [address] [place]  [phone] [email or web address]   [facility] [address] [place], [place] [phone], Ext. [phone] Second and Fourth Thursday of each month, opens at 6:30 AM; Clinic ends at 9 AM.  Patients are seen on a first-come first-served basis, and a limited number are seen during each clinic.   [facility]  [address] Hillard Danker [place], [place] [phone]   Eligibility Requirements You must have lived in [place], [place], or [place] counties for at least the last three months.   You cannot be eligible for state or federal sponsored [employer], including [employer], [place], or [employer].   You generally cannot be eligible for healthcare insurance through your employer.    How to apply: Eligibility screenings are held every Tuesday and Wednesday afternoon from 1:00 pm until  4:00  pm. You do not need an appointment for the interview!  [facility] [address] [place], [place]   [place]  [place] Department  [place]  [phone]    Behavioral Health Resources in the Community: Intensive Outpatient Programs Organization         Address  Phone  Notes  [place]. [address] [place], [place] [phone]   [facility] [address] [place], [place]   ADS: Alcohol & Drug Svcs [address], [place], [place]   [place] [address],  [place], [place] or [phone]   Substance Abuse Resources Organization         Address  Phone  Notes  Alcohol and Drug Services  [phone]   [place]  [phone]   [facility] [place]   Chinita Pester  [phone]   Residential & Outpatient Substance Abuse Program  [phone]   Psychological Services Organization         Address  Phone  Notes  [facility]  [place]  [phone]   [place] [address], [place] or [phone]    Mobile Crisis Teams Organization         Address  Phone  Notes  [facility], Mobile Crisis Care Unit  [phone]   [facility]  [address] [place], [place]   [place] Levels [address] [place] [phone]    Self-Help/Support Groups Organization         Address  Phone             Notes  [place]. of [place] - variety of support groups  [place] Call for more information  Narcotics Anonymous (NA), [facility] [address], [employer] [place]  2 meetings at this location   Special educational needs teacher          Address  Phone  Notes  [facility] Residential Treatment [place],    [place]  [phone]   [facility]  [address], [place] [phone], [place], [place]   [place], [place] [phone] Admissions: 8am-3pm M-F  Incentives Substance [place] [address],    [place], [place] [phone]   [facility] [address] [place], [place], [place]   [facility] [address]  [place], [place]   [facility]ight Programs - Intensive Outpatient [address], [place], [place]   [facility] ([place] [place].,  [place], [place] [phone] or [phone]   [facility] (RTS) [address], [place], [place] Accepts Medicaid  [facility] [place] [address],  [place] [phone] Substance Abuse/Addiction Treatment   [facility] Organization         Address  Phone  Notes  [facility]  [phone]   Domenic Schwab, PhD [address] Arlis [place], [place]   [phone] or [phone]   [place], [place] [phone]   [place] [address], [place], [place] [phone] Insurance/Medicaid/sponsorship through [employer] and Families [address]                                    [place], [place] [phone]) [phone] Therapy/tele-psych/case  [facility] [address]   [place], [place] [phone]) [phone]    Dr. Lolly MustacheArfeen  [phone]   [facility] [place]  United Way [facility] 1) [address], [place] 2) [address], [place] 3)  [address], [place] [phone]  [phone]) [phone]   [facility] Child Abuse Hotline [phone]) [phone] or [phone] (After Hours)
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
## 2015-01-25 ENCOUNTER — Encounter (HOSPITAL_COMMUNITY): Payer: Self-pay | Admitting: Emergency Medicine

## 2016-03-04 IMAGING — CT CT HEAD W/O CM
2 series · 16 of 30 positions shown, 20 images · non-contrast
Comparison: None.

CLINICAL DATA: Headache and vomiting.

EXAM:
CT HEAD WITHOUT CONTRAST
TECHNIQUE: Contiguous axial images were obtained from the base of the skull
through the vertex without intravenous contrast.

[Series 201: head w/o, idose (1) · axial · non-contrast · 0.49mm/px · z∈[+101,+226]mm · 13 of 31 slices shown, 17 images]
[im 3/31  brain]
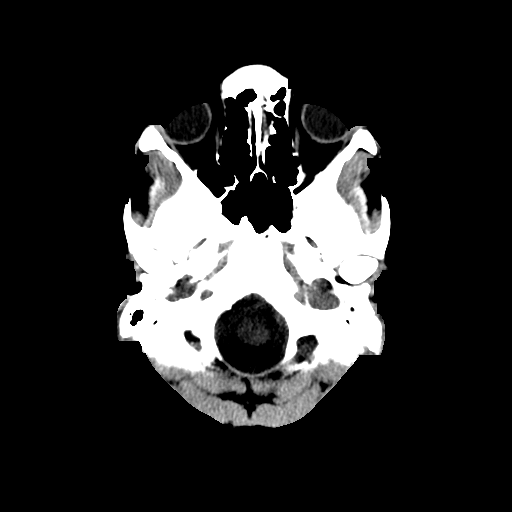
[im 3/31  bone]
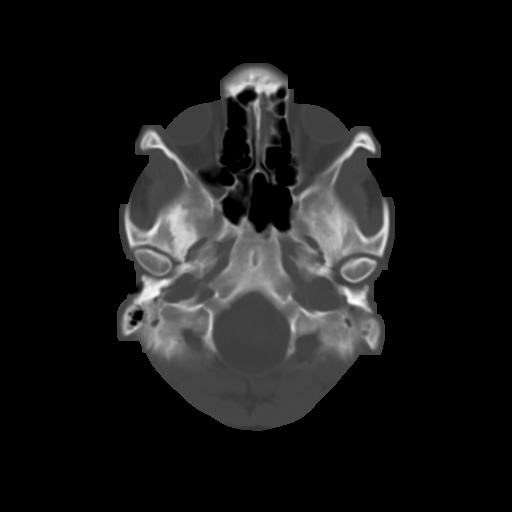
[im 5/31  brain]
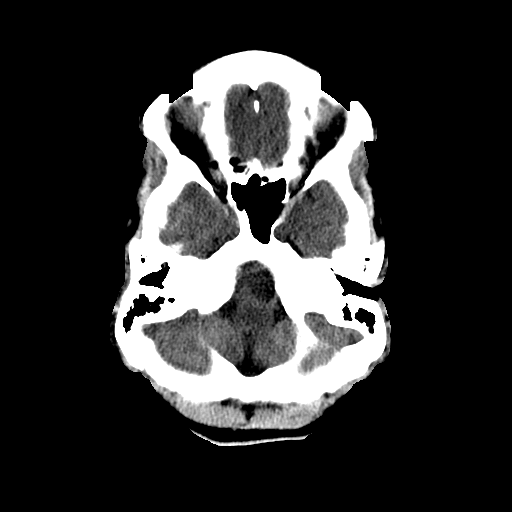
[im 7/31  brain]
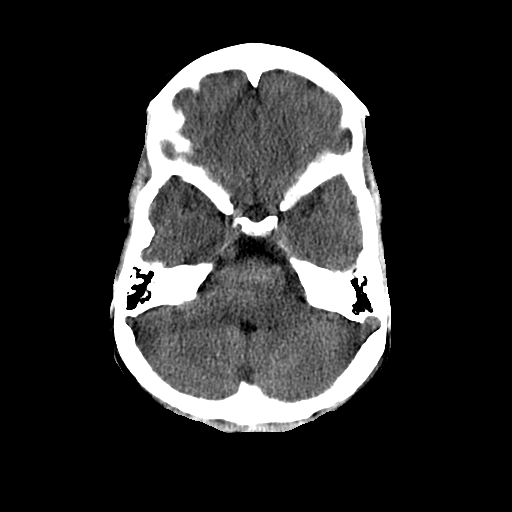
[im 9/31  brain]
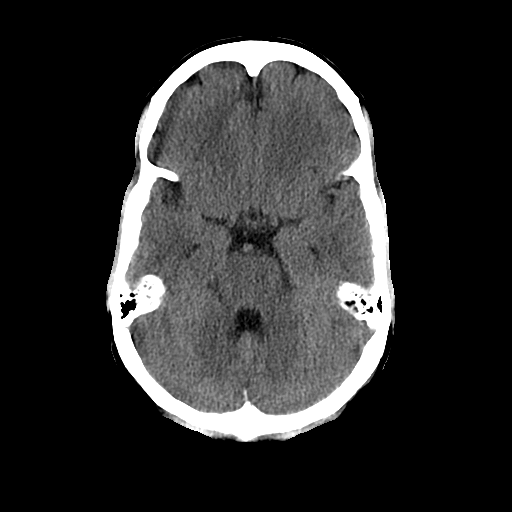
[im 11/31  brain]
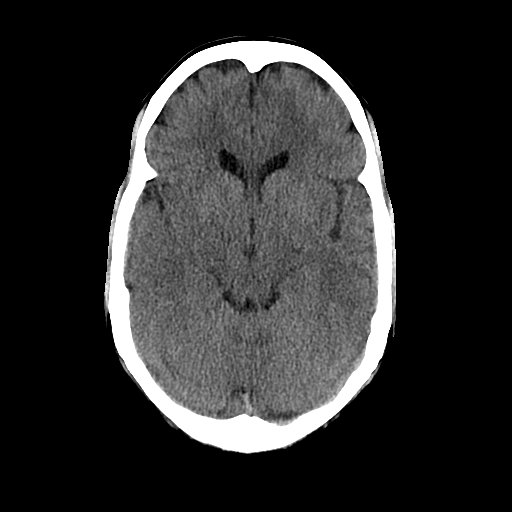
[im 11/31  bone]
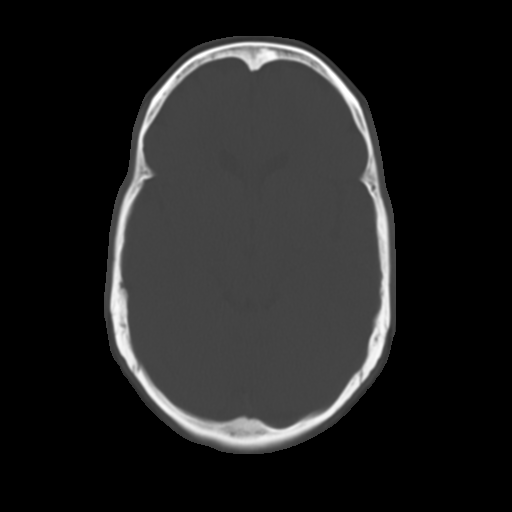
[im 13/31  brain]
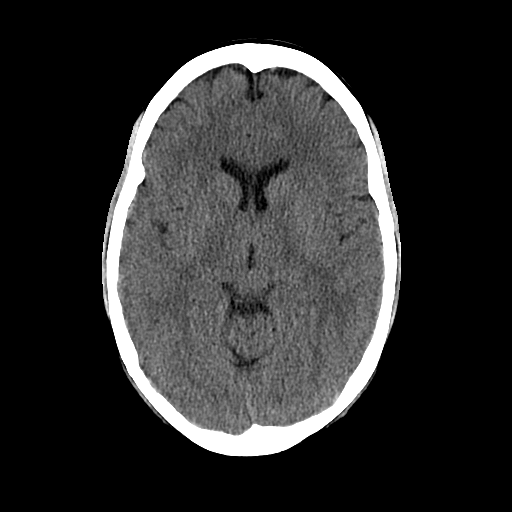
[im 16/31  brain]
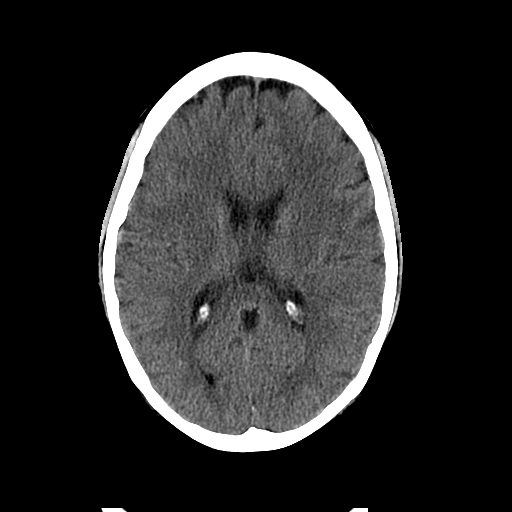
[im 18/31  brain]
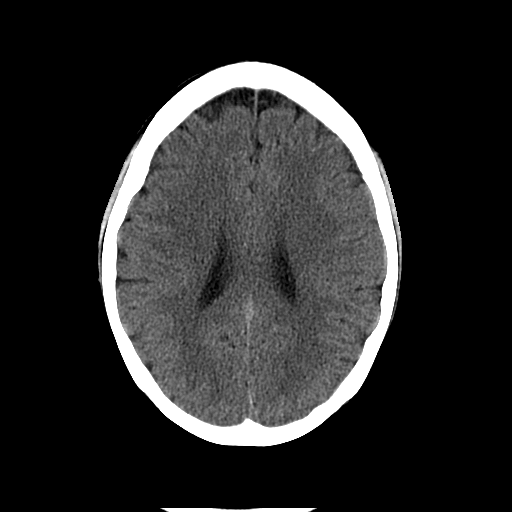
[im 20/31  brain]
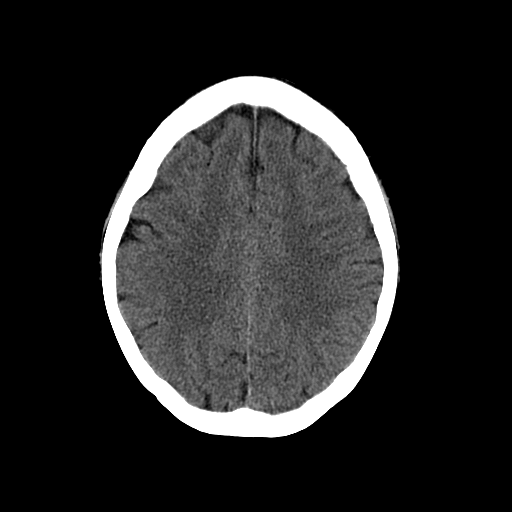
[im 20/31  bone]
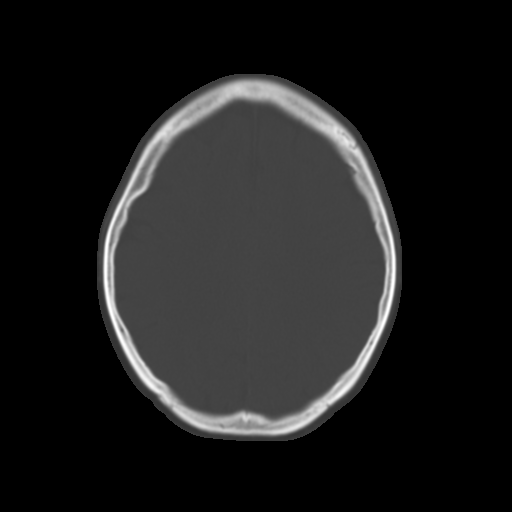
[im 22/31  brain]
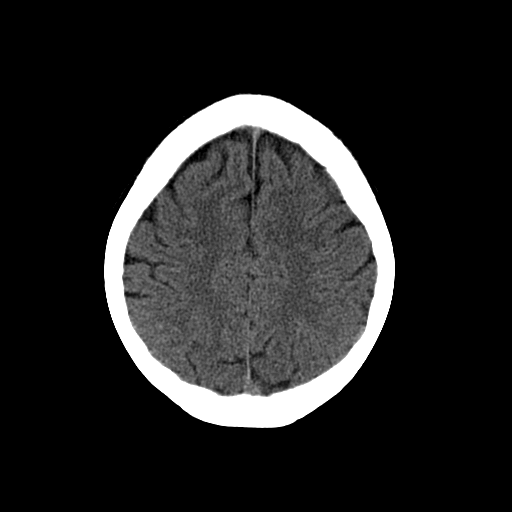
[im 24/31  brain]
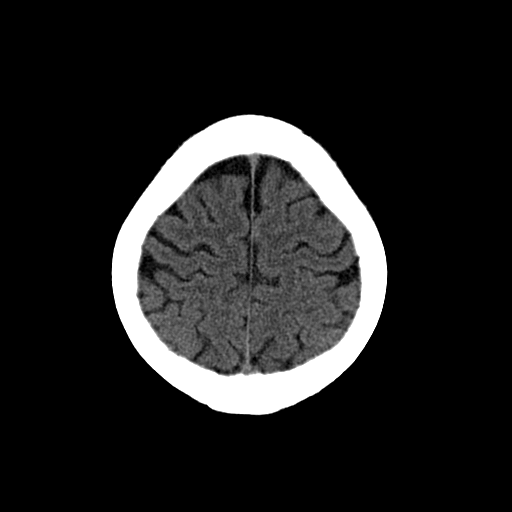
[im 26/31  brain]
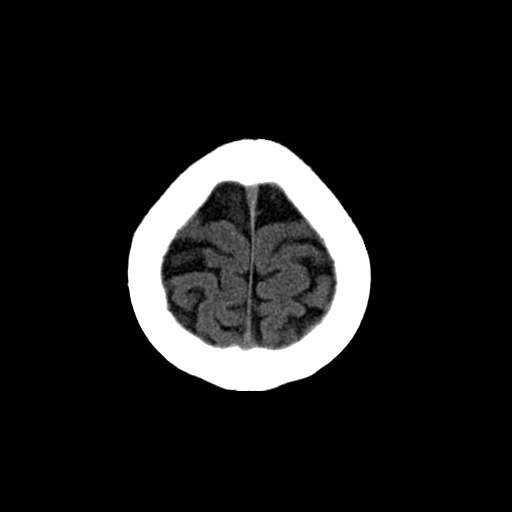
[im 28/31  brain]
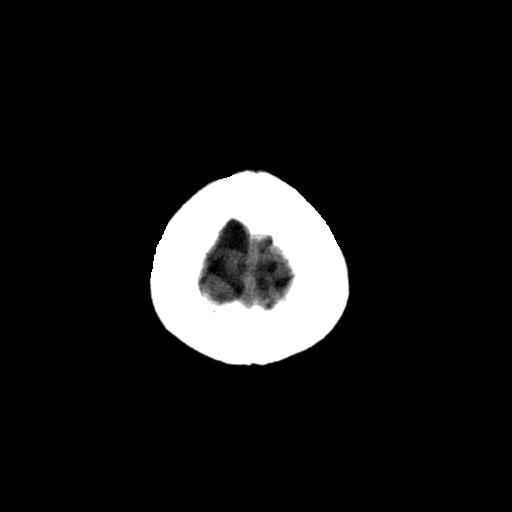
[im 28/31  bone]
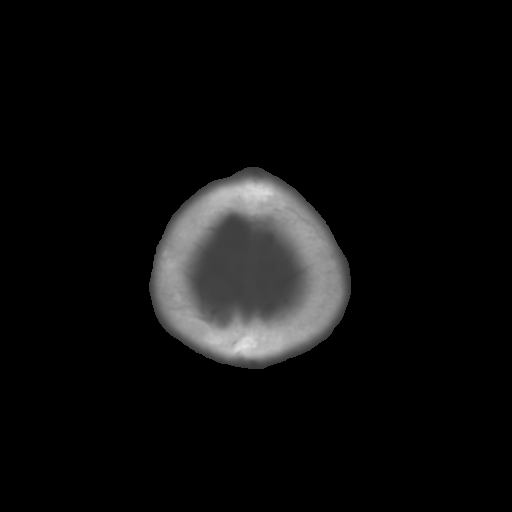

[Series 202: head w/o bone, idose (1) · axial · non-contrast · 0.49mm/px · z∈[+101,+141]mm · 3 of 31 slices shown]
[im 3/31  bone]
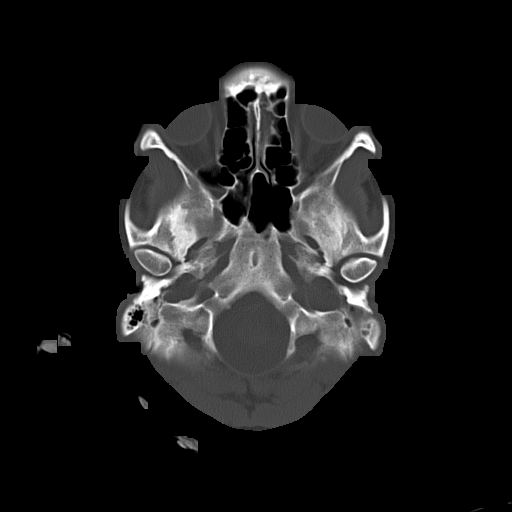
[im 7/31  bone]
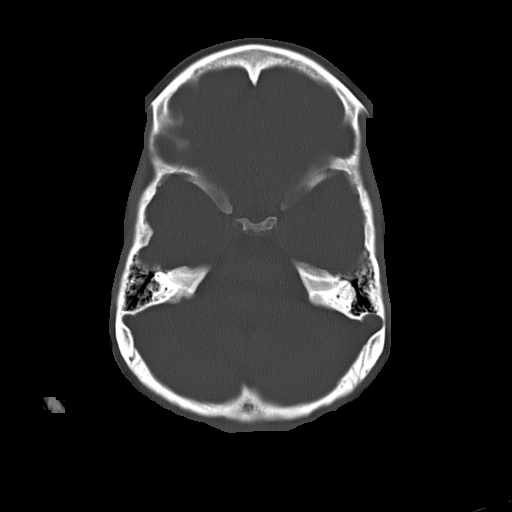
[im 11/31  bone]
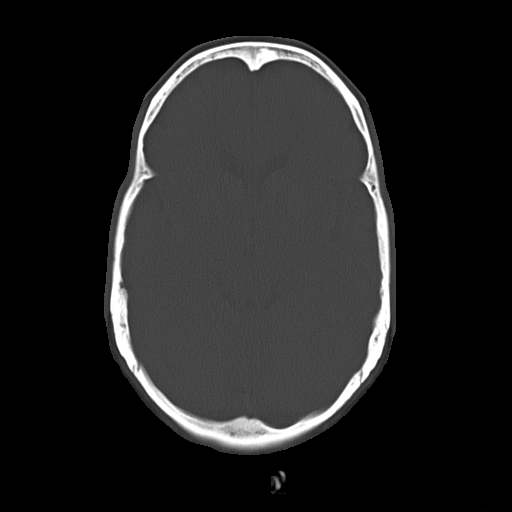

[16 of 30 positions shown; findings below may reference images not displayed]

FINDINGS: The ventricles and sulci are normal. No intraparenchymal hemorrhage,
mass effect nor midline shift. No acute large vascular territory
infarcts.

No abnormal extra-axial fluid collections. Basal cisterns are
patent.

No skull fracture. The included ocular globes and orbital contents
are non-suspicious. The mastoid aircells and included paranasal
sinuses are well-aerated.
IMPRESSION: No acute intracranial process; normal noncontrast CT of the head.

  By: Kuusela Barck
